# Patient Record
Sex: Female | Born: 1950 | Race: White | Hispanic: No | Marital: Married | Smoking: Former smoker
Health system: Southern US, Community
[De-identification: ages and names within clinical notes are randomized; demographics above are authoritative.]

## PROBLEM LIST (undated history)

## (undated) DIAGNOSIS — E079 Disorder of thyroid, unspecified: Secondary | ICD-10-CM

## (undated) DIAGNOSIS — I4891 Unspecified atrial fibrillation: Secondary | ICD-10-CM

## (undated) DIAGNOSIS — I10 Essential (primary) hypertension: Secondary | ICD-10-CM

## (undated) DIAGNOSIS — M791 Myalgia, unspecified site: Secondary | ICD-10-CM

## (undated) DIAGNOSIS — K589 Irritable bowel syndrome without diarrhea: Secondary | ICD-10-CM

## (undated) DIAGNOSIS — R252 Cramp and spasm: Secondary | ICD-10-CM

## (undated) HISTORY — PX: KNEE SURGERY: SHX244

## (undated) HISTORY — PX: BREAST SURGERY: SHX581

## (undated) HISTORY — DX: Irritable bowel syndrome without diarrhea: K58.9

## (undated) HISTORY — PX: BACK SURGERY: SHX140

## (undated) HISTORY — DX: Myalgia, unspecified site: M79.10

## (undated) HISTORY — PX: GALLBLADDER SURGERY: SHX652

## (undated) HISTORY — DX: Unspecified atrial fibrillation: I48.91

## (undated) HISTORY — DX: Essential (primary) hypertension: I10

## (undated) HISTORY — PX: THROAT SURGERY: SHX803

## (undated) HISTORY — DX: Disorder of thyroid, unspecified: E07.9

## (undated) HISTORY — DX: Cramp and spasm: R25.2

---

## 2013-06-05 ENCOUNTER — Ambulatory Visit (INDEPENDENT_AMBULATORY_CARE_PROVIDER_SITE_OTHER): Payer: BC Managed Care – PPO

## 2013-06-05 ENCOUNTER — Encounter: Payer: Self-pay | Admitting: Podiatrist

## 2013-06-05 ENCOUNTER — Ambulatory Visit (INDEPENDENT_AMBULATORY_CARE_PROVIDER_SITE_OTHER): Payer: BC Managed Care – PPO | Admitting: Podiatrist

## 2013-06-05 VITALS — BP 133/84 | HR 91 | Resp 16 | Ht 64.0 in | Wt 239.0 lb

## 2013-06-05 DIAGNOSIS — B351 Tinea unguium: Secondary | ICD-10-CM

## 2013-06-05 DIAGNOSIS — R52 Pain, unspecified: Secondary | ICD-10-CM

## 2013-06-05 DIAGNOSIS — M722 Plantar fascial fibromatosis: Secondary | ICD-10-CM

## 2013-06-05 NOTE — Patient Instructions (Signed)

## 2013-06-11 NOTE — Progress Notes (Signed)
Subjective:  Patient presents today for follow up of Heel pain of which she has been doing well with her orthotics.  Patient has had her current orthotics for several years and they are wearing out.  The inserts have done well to prevent her plantar fasciitis from flaring back up.  Objective:  GENERAL APPEARANCE: Alert, conversant. Appropriately groomed. No acute distress.  VASCULAR: Pedal pulses palpable and strong bilateral.  Capillary refill time is immediate to all digits,  Proximal to distal cooling it warm to warm.  Digital hair growth is present bilateral  NEUROLOGIC: sensation is intact epicritically and protectively to 5.07 monofilament at 5/5 sites bilateral.  Light touch is intact bilateral, vibratory sensation intact bilateral, achilles tendon reflex is intact bilateral.  MUSCULOSKELETAL: acceptable muscle strength, tone and stability bilateral.  Intrinsic muscluature intact bilateral.  Rectus appearance of foot and digits noted bilateral. Mild tenderness at the insertion of the plantar fascia on the medial calcaneal tubercle noted b/l  DERMATOLOGIC: skin color, texture, and turger are within normal limits.  No preulcerative lesions are seen, no interdigital maceration noted.  No open lesions present.  Digital nails are asymptomatic.   Assessment:  Plantar fasciitis b/l- well controlled with orthotics  Plan:  Discussed treatment options.  Orthotic scan carried out.  We will notify patient when inserts are ready for pick up and dispensing.

## 2013-07-17 ENCOUNTER — Ambulatory Visit (INDEPENDENT_AMBULATORY_CARE_PROVIDER_SITE_OTHER): Payer: BC Managed Care – PPO | Admitting: Podiatrist

## 2013-07-17 ENCOUNTER — Encounter: Payer: Self-pay | Admitting: Podiatrist

## 2014-09-25 ENCOUNTER — Telehealth: Payer: Self-pay | Admitting: *Deleted

## 2014-09-25 ENCOUNTER — Ambulatory Visit: Payer: Self-pay | Admitting: Family Medicine

## 2014-09-25 NOTE — Telephone Encounter (Signed)
Pt states Dr. Irving ShowsEgerton found a sewing needle in her left foot and it had not bothered her until now, she has a pinching, stinging sensation.  I called pt's [phone], her husband answered and said to call back on the same number to get her to pick up.  I called the phone number again and it rang without voicemail answering.  I called and the pt's husband answered, informed him that if the area did not have redness, or drainage, and if it hurt to walk on it, to rest, and to call our appt line to set up an appt as soon as possible.

## 2016-05-24 IMAGING — CR DG FOOT COMPLETE 3+V*L*
3 series · 3 of 3 positions shown · non-contrast
Comparison: None.

CLINICAL DATA: Pain and numbness in the plantar aspect of the foot
after working on a ladder yesterday. Known sewing needle in the soft
tissues of the plantar aspect of the foot for over 2 years.

EXAM:
LEFT FOOT - COMPLETE 3+ VIEW

[foot ap]
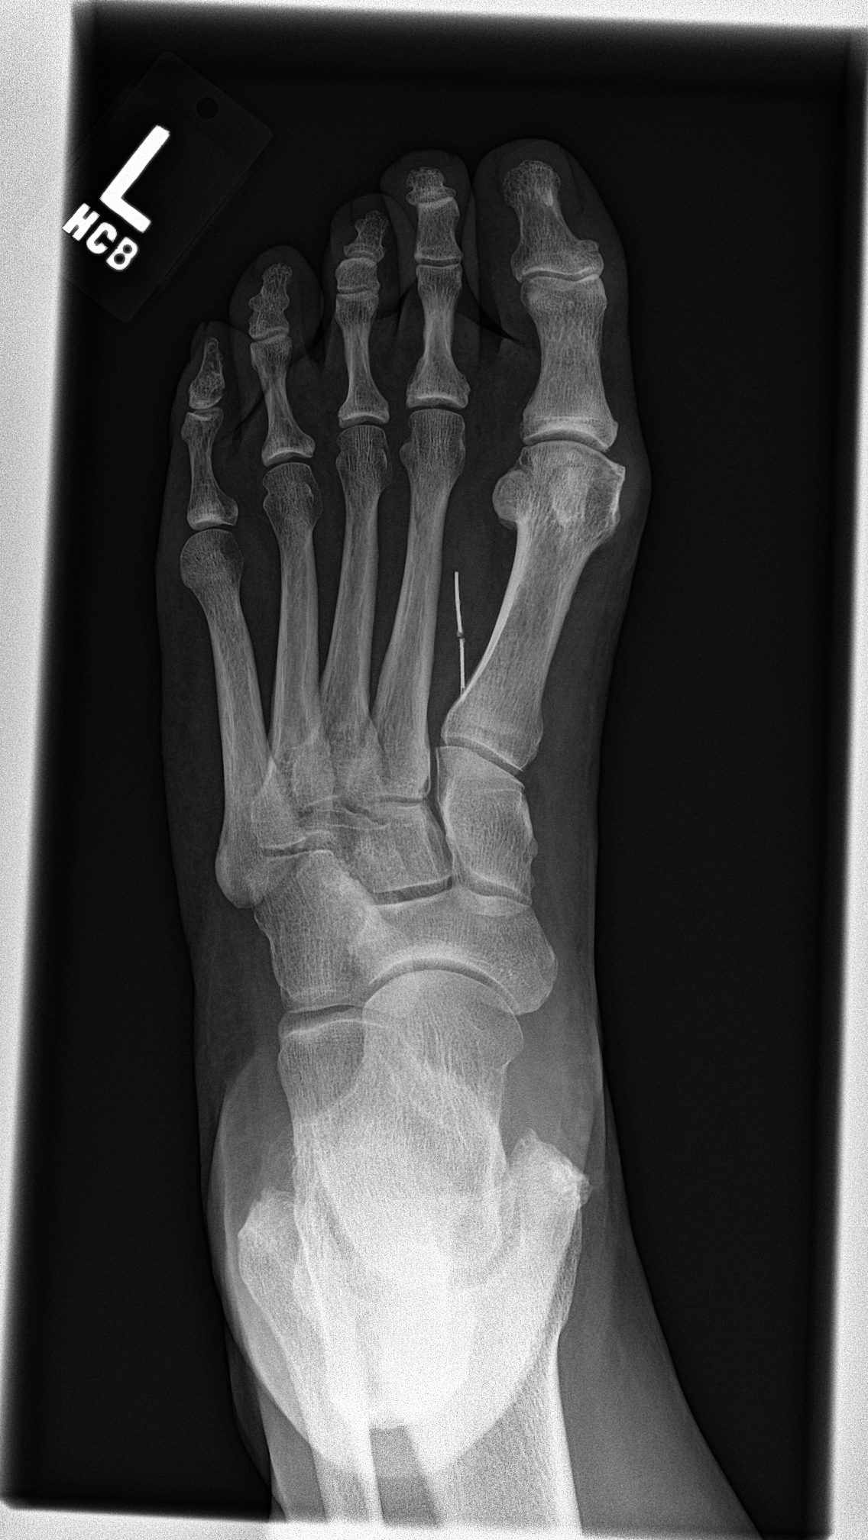

[foot obl]
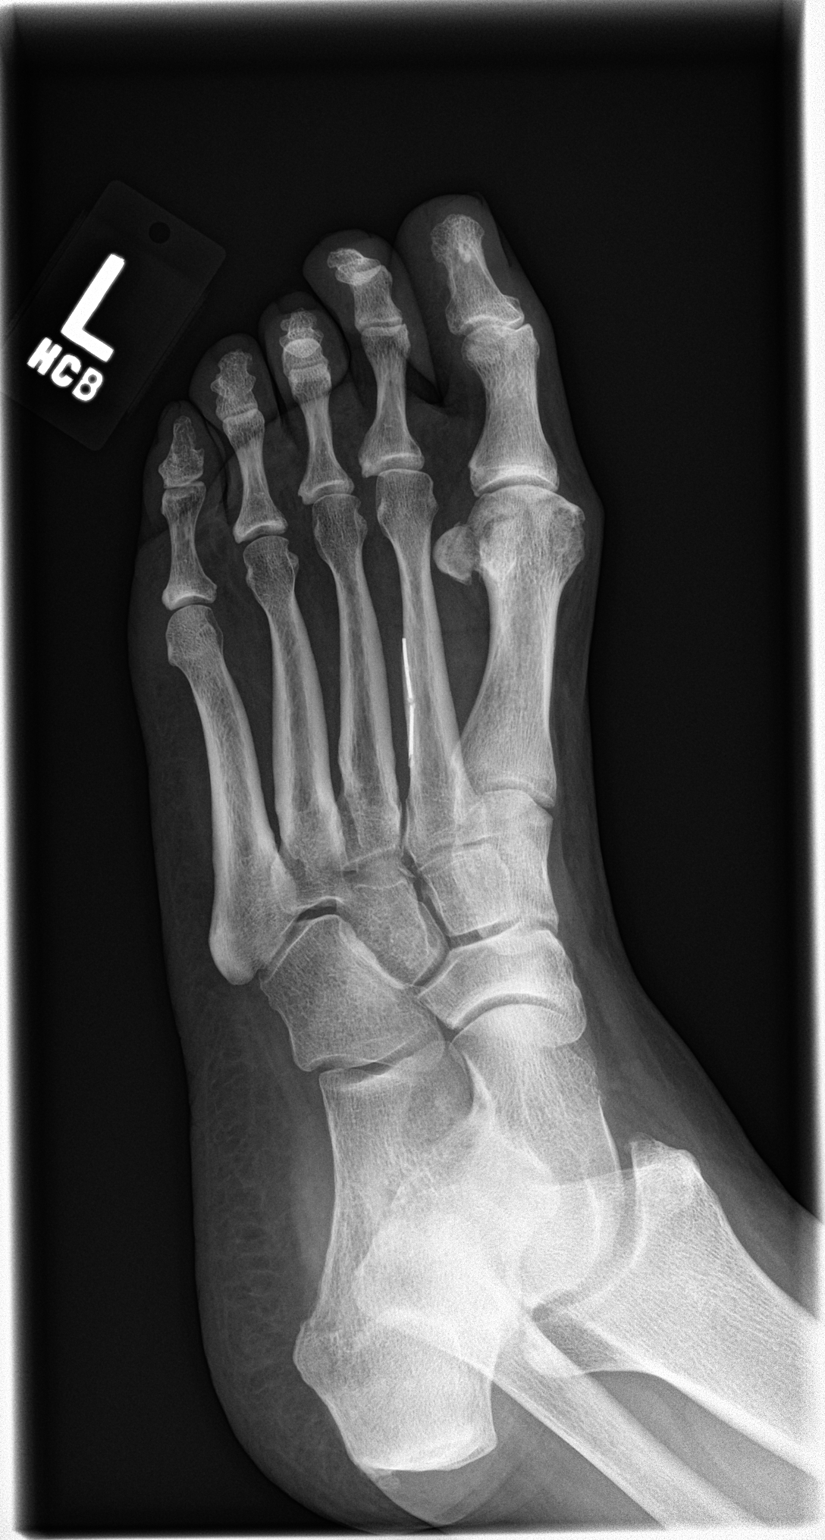

[foot lat]
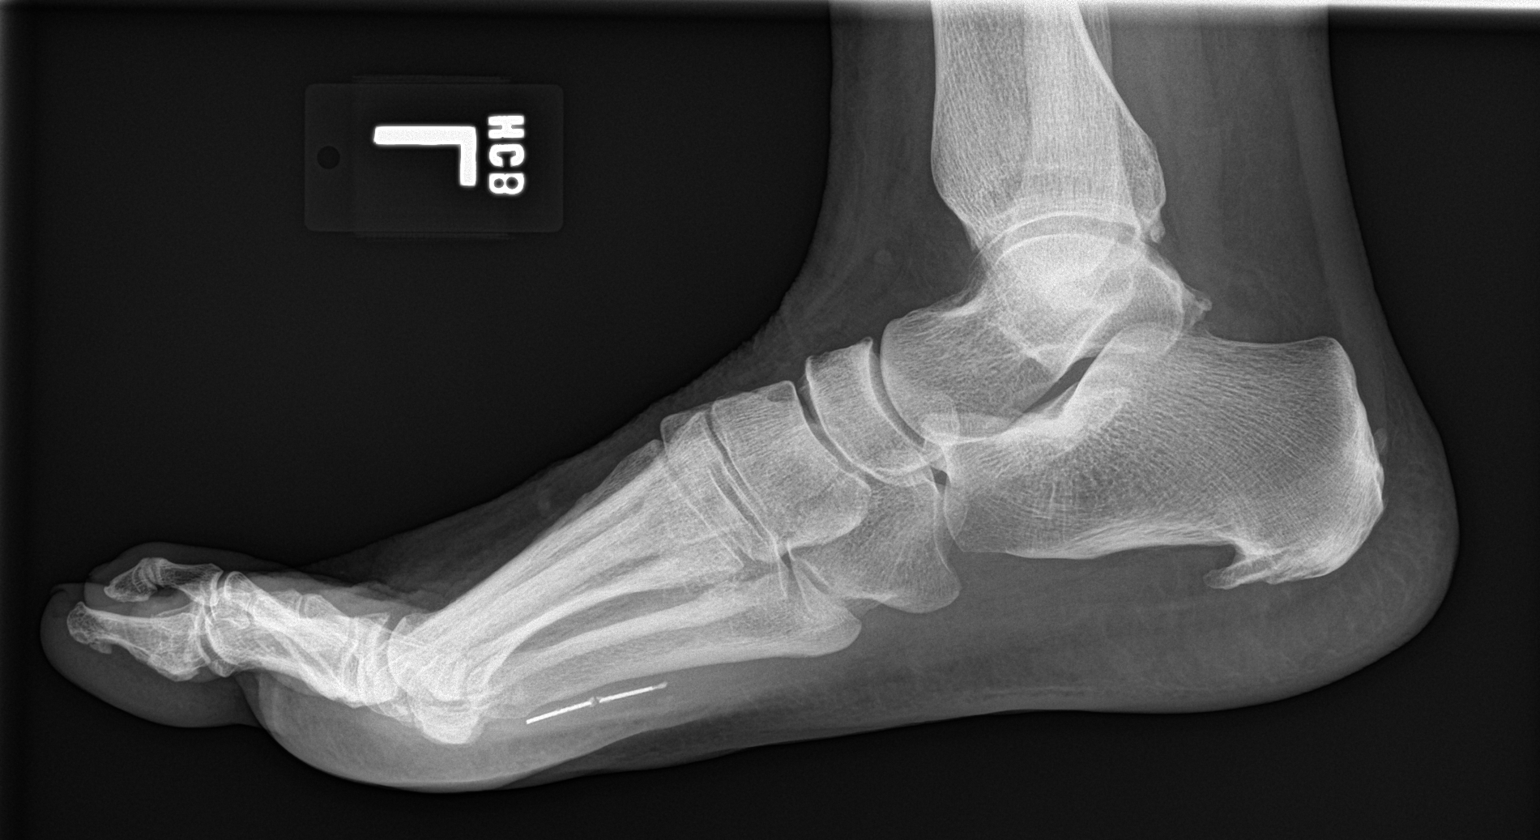

[3 of 3 positions shown; findings below may reference images not displayed]

FINDINGS: There is a broken solid needle in the soft tissues of the plantar
aspect of foot between the first and second metatarsals. The
fracture of the needle is not acute.

There is slight osteoarthritis of first metatarsal phalangeal joint
with slight bunion formation at the head of the metatarsal. Minimal
degenerative changes at the bases of the proximal phalanges of the
second and third toes. Slight arthritic changes at the ankle joint.
Prominent plantar calcaneal spur.
IMPRESSION: No acute abnormality. Degenerative changes as described. Chronic
fractured sewing needle in the soft tissues of the plantar aspect of
the foot.

## 2020-03-24 ENCOUNTER — Ambulatory Visit: Payer: Self-pay | Admitting: Podiatry
